# Patient Record
Sex: Male | Born: 1995 | Race: White | Hispanic: No | Marital: Single | Smoking: Never smoker
Health system: Southern US, Community
[De-identification: ages and names within clinical notes are randomized; demographics above are authoritative.]

---
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
## 2018-09-27 DIAGNOSIS — L255 Unspecified contact dermatitis due to plants, except food: Secondary | ICD-10-CM | POA: Diagnosis not present

## 2019-08-12 ENCOUNTER — Encounter: Payer: Self-pay | Admitting: Family Medicine

## 2019-08-12 ENCOUNTER — Other Ambulatory Visit: Payer: Self-pay

## 2019-08-12 ENCOUNTER — Ambulatory Visit (INDEPENDENT_AMBULATORY_CARE_PROVIDER_SITE_OTHER): Payer: BC Managed Care – PPO | Admitting: Family Medicine

## 2019-08-12 VITALS — BP 124/74 | HR 79 | Temp 98.4°F | Ht 71.0 in | Wt 188.6 lb

## 2019-08-12 DIAGNOSIS — Z13220 Encounter for screening for lipoid disorders: Secondary | ICD-10-CM | POA: Diagnosis not present

## 2019-08-12 DIAGNOSIS — Z131 Encounter for screening for diabetes mellitus: Secondary | ICD-10-CM | POA: Diagnosis not present

## 2019-08-12 DIAGNOSIS — N5089 Other specified disorders of the male genital organs: Secondary | ICD-10-CM

## 2019-08-12 DIAGNOSIS — Z0001 Encounter for general adult medical examination with abnormal findings: Secondary | ICD-10-CM

## 2019-08-12 DIAGNOSIS — R0789 Other chest pain: Secondary | ICD-10-CM | POA: Diagnosis not present

## 2019-08-12 DIAGNOSIS — E7800 Pure hypercholesterolemia, unspecified: Secondary | ICD-10-CM | POA: Insufficient documentation

## 2019-08-12 DIAGNOSIS — Z6826 Body mass index (BMI) 26.0-26.9, adult: Secondary | ICD-10-CM

## 2019-08-12 DIAGNOSIS — E663 Overweight: Secondary | ICD-10-CM

## 2019-08-12 LAB — LIPID PANEL
Cholesterol: 169 mg/dL (ref 0–200)
HDL: 41.1 mg/dL (ref >39.00)
LDL Cholesterol: 114 mg/dL — ABNORMAL HIGH (ref 0–99)
NonHDL: 127.9
Total CHOL/HDL Ratio: 4
Triglycerides: 69 mg/dL (ref 0.0–149.0)
VLDL: 13.8 mg/dL (ref 0.0–40.0)

## 2019-08-12 LAB — GLUCOSE, RANDOM: Glucose, Bld: 90 mg/dL (ref 70–99)

## 2019-08-12 NOTE — Progress Notes (Signed)
Please inform patient of the following:  His "bad" cholesterol just a little bit high.  The rest of his labs are normal.  Do not need start medication at this point but he should continue working on eating healthy diet and staying active.  We can recheck in 1 year or so.  Randall Cain. Jerline Pain, MD 08/12/2019 4:42 PM

## 2019-08-12 NOTE — Patient Instructions (Signed)
It was very nice to see you today!  Your chest pain is from a strained muscle. You can use heating pads and antiinflammatories as needed. Let me know if it worsens.  Your testicle lump is benign. This is called a varicocele. Let me know if it changes in anyway.  We will check blood work today.  Come back to see me in 1 year, or sooner if needed.   Take care, Dr Jerline Pain  Please try these tips to maintain a healthy lifestyle:   Eat at least 3 REAL meals and 1-2 snacks per day.  Aim for no more than 5 hours between eating.  If you eat breakfast, please do so within one hour of getting up.    Obtain twice as many fruits/vegetables as protein or carbohydrate foods for both lunch and dinner. (Half of each meal should be fruits/vegetables, one quarter protein, and one quarter starchy carbs)   Cut down on sweet beverages. This includes juice, soda, and sweet tea.    Exercise at least 150 minutes every week.    Preventive Care 59-18 Years Old, Male Preventive care refers to lifestyle choices and visits with your health care provider that can promote health and wellness. This includes:  A yearly physical exam. This is also called an annual well check.  Regular dental and eye exams.  Immunizations.  Screening for certain conditions.  Healthy lifestyle choices, such as eating a healthy diet, getting regular exercise, not using drugs or products that contain nicotine and tobacco, and limiting alcohol use. What can I expect for my preventive care visit? Physical exam Your health care provider will check:  Height and weight. These may be used to calculate body mass index (BMI), which is a measurement that tells if you are at a healthy weight.  Heart rate and blood pressure.  Your skin for abnormal spots. Counseling Your health care provider may ask you questions about:  Alcohol, tobacco, and drug use.  Emotional well-being.  Home and relationship well-being.  Sexual  activity.  Eating habits.  Work and work Statistician. What immunizations do I need?  Influenza (flu) vaccine  This is recommended every year. Tetanus, diphtheria, and pertussis (Tdap) vaccine  You may need a Td booster every 10 years. Varicella (chickenpox) vaccine  You may need this vaccine if you have not already been vaccinated. Human papillomavirus (HPV) vaccine  If recommended by your health care provider, you may need three doses over 6 months. Measles, mumps, and rubella (MMR) vaccine  You may need at least one dose of MMR. You may also need a second dose. Meningococcal conjugate (MenACWY) vaccine  One dose is recommended if you are 13-71 years old and a Market researcher living in a residence hall, or if you have one of several medical conditions. You may also need additional booster doses. Pneumococcal conjugate (PCV13) vaccine  You may need this if you have certain conditions and were not previously vaccinated. Pneumococcal polysaccharide (PPSV23) vaccine  You may need one or two doses if you smoke cigarettes or if you have certain conditions. Hepatitis A vaccine  You may need this if you have certain conditions or if you travel or work in places where you may be exposed to hepatitis A. Hepatitis B vaccine  You may need this if you have certain conditions or if you travel or work in places where you may be exposed to hepatitis B. Haemophilus influenzae type b (Hib) vaccine  You may need this if you have  certain risk factors. You may receive vaccines as individual doses or as more than one vaccine together in one shot (combination vaccines). Talk with your health care provider about the risks and benefits of combination vaccines. What tests do I need? Blood tests  Lipid and cholesterol levels. These may be checked every 5 years starting at age 88.  Hepatitis C test.  Hepatitis B test. Screening   Diabetes screening. This is done by checking your  blood sugar (glucose) after you have not eaten for a while (fasting).  Sexually transmitted disease (STD) testing. Talk with your health care provider about your test results, treatment options, and if necessary, the need for more tests. Follow these instructions at home: Eating and drinking   Eat a diet that includes fresh fruits and vegetables, whole grains, lean protein, and low-fat dairy products.  Take vitamin and mineral supplements as recommended by your health care provider.  Do not drink alcohol if your health care provider tells you not to drink.  If you drink alcohol: ? Limit how much you have to 0-2 drinks a day. ? Be aware of how much alcohol is in your drink. In the [place] one drink equals one 12 oz bottle of beer (355 mL), one 5 oz glass of wine (148 mL), or one 1 oz glass of hard liquor (44 mL). Lifestyle  Take daily care of your teeth and gums.  Stay active. Exercise for at least 30 minutes on 5 or more days each week.  Do not use any products that contain nicotine or tobacco, such as cigarettes, e-cigarettes, and chewing tobacco. If you need help quitting, ask your health care provider.  If you are sexually active, practice safe sex. Use a condom or other form of protection to prevent STIs (sexually transmitted infections). What's next?  Go to your health care provider once a year for a well check visit.  Ask your health care provider how often you should have your eyes and teeth checked.  Stay up to date on all vaccines. This information is not intended to replace advice given to you by your health care provider. Make sure you discuss any questions you have with your health care provider. Document Released: 02/10/2002 Document Revised: 12/09/2018 Document Reviewed: 12/09/2018 Elsevier Patient Education  2020 [employer].

## 2019-08-12 NOTE — Progress Notes (Signed)
Chief Complaint:  Randall Cain is a 23 y.o. male who presents today for his annual comprehensive physical exam and to establish care.   Assessment/Plan:  Chest Wall Pain No red flags.  Consistent with muscular strain.  Does not currently have any symptoms.  Recommended NSAIDs as needed.  Discussed reasons to return to care.  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  Given his symptoms have not changed for several years, this is most likely a benign finding.  We will continue with watchful waiting.  Discussed reasons to return to care.   Body mass index is 26.3 kg/m. / Overweight BMI Metric Follow Up - 08/12/19 0926      BMI Metric Follow Up-Please document annually   BMI Metric Follow Up  Education provided       Preventative Healthcare: Check lipid panel and fasting glucose.   Patient Counseling(The following topics were reviewed and/or handout was given):  -Nutrition: Stressed importance of moderation in sodium/caffeine intake, saturated fat and cholesterol, caloric balance, sufficient intake of fresh fruits, vegetables, and fiber.  -Stressed the importance of regular exercise.   -Substance Abuse: Discussed cessation/primary prevention of tobacco, alcohol, or other drug use; driving or other dangerous activities under the influence; availability of treatment for abuse.   -Injury prevention: Discussed safety belts, safety helmets, smoke detector, smoking near bedding or upholstery.   -Sexuality: Discussed sexually transmitted diseases, partner selection, use of condoms, avoidance of unintended pregnancy and contraceptive alternatives.   -Dental health: Discussed importance of regular tooth brushing, flossing, and dental visits.  -Health maintenance and immunizations reviewed. Please refer to Health maintenance section.  Return to care in 1 year for next preventative visit.     Subjective:  HPI:  He has no acute complaints today.   Chest Pain No current chest  pain.  Patient reports intermittent episodes over the past few years of left-sided chest pain that starts in the middle of his chest and then progresses to his left shoulder over a few days.  Pain is usually worse with certain movements and positions.  Pain is also worse with palpation.  Usually takes ibuprofen which alleviates the pain.  Patient's father died from a heart attack earlier this year and is worried about possible heart disease.  Does not have any exertional symptoms.  No reported shortness of breath.  Also has a testicular lump on his left testicle.  This is been there for several years.  No obvious precipitating events.  No pain.  Has not changed in size for several years. Lifestyle Diet: Trying to eat and balanced diet.  Exercise: Likes to play golf. Plays in slow pitch softball league.   Depression screen PHQ 2/9 08/12/2019  Decreased Interest 0  Down, Depressed, Hopeless 0  PHQ - 2 Score 0    Health Maintenance Due  Topic Date Due  . HIV Screening  10/05/2009  . TETANUS/TDAP  11/20/2015  . INFLUENZA VACCINE  07/30/2019    ROS: Per HPI, otherwise a complete review of systems was negative.   PMH:  The following were reviewed and entered/updated in epic: History reviewed. No pertinent past medical history. There are no active problems to display for this patient.  History reviewed. No pertinent surgical history.  Family History  Problem Relation Age of Onset  . Heart disease Father     Medications- reviewed and updated No current outpatient medications on file.   No current facility-administered medications for this visit.     Allergies-reviewed and updated Allergies  Allergen  Reactions  . Amoxicillin Rash  . Clarithromycin Rash    Social History   Socioeconomic History  . Marital status: Single    Spouse name: Not on file  . Number of children: Not on file  . Years of education: Not on file  . Highest education level: Not on file  Occupational  History  . Not on file  Social Needs  . Financial resource strain: Not on file  . Food insecurity    Worry: Not on file    Inability: Not on file  . Transportation needs    Medical: Not on file    Non-medical: Not on file  Tobacco Use  . Smoking status: Never Smoker  . Smokeless tobacco: Current User    Types: Snuff  Substance and Sexual Activity  . Alcohol use: Yes  . Drug use: Yes    Types: Marijuana    Comment: Only on weekends  . Sexual activity: Not on file  Lifestyle  . Physical activity    Days per week: Not on file    Minutes per session: Not on file  . Stress: Not on file  Relationships  . Social Musicianconnections    Talks on phone: Not on file    Gets together: Not on file    Attends religious service: Not on file    Active member of club or organization: Not on file    Attends meetings of clubs or organizations: Not on file    Relationship status: Not on file  Other Topics Concern  . Not on file  Social History Narrative  . Not on file        Objective:  Physical Exam: BP 124/74 (BP Location: Left Arm, Patient Position: Sitting, Cuff Size: Normal)   Pulse 79   Temp 98.4 F (36.9 C) (Oral)   Ht 5\' 11"  (1.803 m)   Wt 188 lb 9.6 oz (85.5 kg)   SpO2 97%   BMI 26.30 kg/m   Body mass index is 26.3 kg/m. Wt Readings from Last 3 Encounters:  08/12/19 188 lb 9.6 oz (85.5 kg)  02/13/12 162 lb 4.8 oz (73.6 kg) (75 %, Z= 0.66)*  12/03/11 151 lb 11.2 oz (68.8 kg) (63 %, Z= 0.33)*   * Growth percentiles are based on CDC (Boys, 2-20 Years) data.   Gen: NAD, resting comfortably HEENT: TMs normal bilaterally. OP clear. No thyromegaly noted.  CV: RRR with no murmurs appreciated Pulm: NWOB, CTAB with no crackles, wheezes, or rhonchi GI: Normal bowel sounds present. Soft, Nontender, Nondistended. GU: Left testicle very small mobile structure on posterior aspect MSK: no edema, cyanosis, or clubbing noted Skin: warm, dry Neuro: CN2-12 grossly intact. Strength 5/5 in  upper and lower extremities. Reflexes symmetric and intact bilaterally.  Psych: Normal affect and thought content     Randall Burch M. Jimmey RalphParker, MD 08/12/2019 9:26 AM

## 2019-10-10 DIAGNOSIS — R5381 Other malaise: Secondary | ICD-10-CM | POA: Diagnosis not present

## 2019-10-10 DIAGNOSIS — Z20828 Contact with and (suspected) exposure to other viral communicable diseases: Secondary | ICD-10-CM | POA: Diagnosis not present

## 2019-10-20 DIAGNOSIS — R430 Anosmia: Secondary | ICD-10-CM | POA: Diagnosis not present

## 2019-10-20 DIAGNOSIS — R438 Other disturbances of smell and taste: Secondary | ICD-10-CM | POA: Diagnosis not present

## 2020-11-02 ENCOUNTER — Ambulatory Visit (INDEPENDENT_AMBULATORY_CARE_PROVIDER_SITE_OTHER): Payer: 59 | Admitting: Family Medicine

## 2020-11-02 ENCOUNTER — Other Ambulatory Visit: Payer: Self-pay

## 2020-11-02 ENCOUNTER — Encounter: Payer: Self-pay | Admitting: Family Medicine

## 2020-11-02 VITALS — BP 127/76 | HR 69 | Temp 98.6°F | Ht 71.0 in | Wt 186.4 lb

## 2020-11-02 DIAGNOSIS — E7800 Pure hypercholesterolemia, unspecified: Secondary | ICD-10-CM

## 2020-11-02 DIAGNOSIS — Z23 Encounter for immunization: Secondary | ICD-10-CM | POA: Diagnosis not present

## 2020-11-02 DIAGNOSIS — Z6826 Body mass index (BMI) 26.0-26.9, adult: Secondary | ICD-10-CM | POA: Diagnosis not present

## 2020-11-02 DIAGNOSIS — E663 Overweight: Secondary | ICD-10-CM

## 2020-11-02 DIAGNOSIS — Z0001 Encounter for general adult medical examination with abnormal findings: Secondary | ICD-10-CM | POA: Diagnosis not present

## 2020-11-02 NOTE — Patient Instructions (Signed)
It was very nice to see you today!  Please give your flu vaccine today.  We will check blood work today.  Please select see me in 1 year for your next physical.  Please come back to see me sooner if needed.  Take care, Dr Jerline Pain  Please try these tips to maintain a healthy lifestyle:   Eat at least 3 REAL meals and 1-2 snacks per day.  Aim for no more than 5 hours between eating.  If you eat breakfast, please do so within one hour of getting up.    Each meal should contain half fruits/vegetables, one quarter protein, and one quarter carbs (no bigger than a computer mouse)   Cut down on sweet beverages. This includes juice, soda, and sweet tea.     Drink at least 1 glass of water with each meal and aim for at least 8 glasses per day   Exercise at least 150 minutes every week.    Preventive Care 33-4 Years Old, Male Preventive care refers to lifestyle choices and visits with your health care provider that can promote health and wellness. This includes:  A yearly physical exam. This is also called an annual well check.  Regular dental and eye exams.  Immunizations.  Screening for certain conditions.  Healthy lifestyle choices, such as eating a healthy diet, getting regular exercise, not using drugs or products that contain nicotine and tobacco, and limiting alcohol use. What can I expect for my preventive care visit? Physical exam Your health care provider will check:  Height and weight. These may be used to calculate body mass index (BMI), which is a measurement that tells if you are at a healthy weight.  Heart rate and blood pressure.  Your skin for abnormal spots. Counseling Your health care provider may ask you questions about:  Alcohol, tobacco, and drug use.  Emotional well-being.  Home and relationship well-being.  Sexual activity.  Eating habits.  Work and work Statistician. What immunizations do I need?  Influenza (flu) vaccine  This is  recommended every year. Tetanus, diphtheria, and pertussis (Tdap) vaccine  You may need a Td booster every 10 years. Varicella (chickenpox) vaccine  You may need this vaccine if you have not already been vaccinated. Human papillomavirus (HPV) vaccine  If recommended by your health care provider, you may need three doses over 6 months. Measles, mumps, and rubella (MMR) vaccine  You may need at least one dose of MMR. You may also need a second dose. Meningococcal conjugate (MenACWY) vaccine  One dose is recommended if you are 12-51 years old and a Market researcher living in a residence hall, or if you have one of several medical conditions. You may also need additional booster doses. Pneumococcal conjugate (PCV13) vaccine  You may need this if you have certain conditions and were not previously vaccinated. Pneumococcal polysaccharide (PPSV23) vaccine  You may need one or two doses if you smoke cigarettes or if you have certain conditions. Hepatitis A vaccine  You may need this if you have certain conditions or if you travel or work in places where you may be exposed to hepatitis A. Hepatitis B vaccine  You may need this if you have certain conditions or if you travel or work in places where you may be exposed to hepatitis B. Haemophilus influenzae type b (Hib) vaccine  You may need this if you have certain risk factors. You may receive vaccines as individual doses or as more than one vaccine together  in one shot (combination vaccines). Talk with your health care provider about the risks and benefits of combination vaccines. What tests do I need? Blood tests  Lipid and cholesterol levels. These may be checked every 5 years starting at age 44.  Hepatitis C test.  Hepatitis B test. Screening   Diabetes screening. This is done by checking your blood sugar (glucose) after you have not eaten for a while (fasting).  Sexually transmitted disease (STD) testing. Talk with  your health care provider about your test results, treatment options, and if necessary, the need for more tests. Follow these instructions at home: Eating and drinking   Eat a diet that includes fresh fruits and vegetables, whole grains, lean protein, and low-fat dairy products.  Take vitamin and mineral supplements as recommended by your health care provider.  Do not drink alcohol if your health care provider tells you not to drink.  If you drink alcohol: ? Limit how much you have to 0-2 drinks a day. ? Be aware of how much alcohol is in your drink. In the [place] one drink equals one 12 oz bottle of beer (355 mL), one 5 oz glass of wine (148 mL), or one 1 oz glass of hard liquor (44 mL). Lifestyle  Take daily care of your teeth and gums.  Stay active. Exercise for at least 30 minutes on 5 or more days each week.  Do not use any products that contain nicotine or tobacco, such as cigarettes, e-cigarettes, and chewing tobacco. If you need help quitting, ask your health care provider.  If you are sexually active, practice safe sex. Use a condom or other form of protection to prevent STIs (sexually transmitted infections). What's next?  Go to your health care provider once a year for a well check visit.  Ask your health care provider how often you should have your eyes and teeth checked.  Stay up to date on all vaccines. This information is not intended to replace advice given to you by your health care provider. Make sure you discuss any questions you have with your health care provider. Document Revised: 12/09/2018 Document Reviewed: 12/09/2018 Elsevier Patient Education  2020 [employer].

## 2020-11-02 NOTE — Assessment & Plan Note (Signed)
Check CBC, CMET, TSH, lipid panel. Continue lifestyle modifications.  

## 2020-11-02 NOTE — Progress Notes (Signed)
Chief Complaint:  Randall Cain is a 24 y.o. male who presents today for his annual comprehensive physical exam.    Assessment/Plan:  Chronic Problems Addressed Today: Elevated LDL cholesterol level Check CBC, CMET, TSH, lipid panel.  Continue lifestyle modifications.  Body mass index is 26 kg/m. / Overweight  BMI Metric Follow Up - 11/02/20 1404      BMI Metric Follow Up-Please document annually   BMI Metric Follow Up Education provided           Preventative Healthcare: Flu vaccine given today.  Discussed Covid vaccine.  Will check CBC, CMET, TSH, lipid panel.  Patient Counseling(The following topics were reviewed and/or handout was given):  -Nutrition: Stressed importance of moderation in sodium/caffeine intake, saturated fat and cholesterol, caloric balance, sufficient intake of fresh fruits, vegetables, and fiber.  -Stressed the importance of regular exercise.   -Substance Abuse: Discussed cessation/primary prevention of tobacco, alcohol, or other drug use; driving or other dangerous activities under the influence; availability of treatment for abuse.   -Injury prevention: Discussed safety belts, safety helmets, smoke detector, smoking near bedding or upholstery.   -Sexuality: Discussed sexually transmitted diseases, partner selection, use of condoms, avoidance of unintended pregnancy and contraceptive alternatives.   -Dental health: Discussed importance of regular tooth brushing, flossing, and dental visits.  -Health maintenance and immunizations reviewed. Please refer to Health maintenance section.  Return to care in 1 year for next preventative visit.     Subjective:  HPI:  He has no acute complaints today.   Lifestyle Diet: Balanced.  Exercise: None.   Depression screen PHQ 2/9 08/12/2019  Decreased Interest 0  Down, Depressed, Hopeless 0  PHQ - 2 Score 0   Health Maintenance Due  Topic Date Due  . Hepatitis C Screening  Never done  . HIV Screening   Never done  . TETANUS/TDAP  11/20/2015    ROS: Per HPI, otherwise a complete review of systems was negative.   PMH:  The following were reviewed and entered/updated in epic: History reviewed. No pertinent past medical history. Patient Active Problem List   Diagnosis Date Noted  . Elevated LDL cholesterol level 08/12/2019   History reviewed. No pertinent surgical history.  Family History  Problem Relation Age of Onset  . Heart disease Father     Medications- reviewed and updated No current outpatient medications on file.   No current facility-administered medications for this visit.    Allergies-reviewed and updated Allergies  Allergen Reactions  . Amoxicillin Rash  . Clarithromycin Rash    Social History   Socioeconomic History  . Marital status: Single    Spouse name: Not on file  . Number of children: Not on file  . Years of education: Not on file  . Highest education level: Not on file  Occupational History  . Not on file  Tobacco Use  . Smoking status: Never Smoker  . Smokeless tobacco: Current User    Types: Snuff  Vaping Use  . Vaping Use: Every day  Substance and Sexual Activity  . Alcohol use: Yes  . Drug use: Yes    Types: Marijuana    Comment: Only on weekends  . Sexual activity: Not on file  Other Topics Concern  . Not on file  Social History Narrative  . Not on file   Social Determinants of Health   Financial Resource Strain:   . Difficulty of Paying Living Expenses: Not on file  Food Insecurity:   . Worried About Radiation protection practitioner  of Food in the Last Year: Not on file  . Ran Out of Food in the Last Year: Not on file  Transportation Needs:   . Lack of Transportation (Medical): Not on file  . Lack of Transportation (Non-Medical): Not on file  Physical Activity:   . Days of Exercise per Week: Not on file  . Minutes of Exercise per Session: Not on file  Stress:   . Feeling of Stress : Not on file  Social Connections:   . Frequency of  Communication with Friends and Family: Not on file  . Frequency of Social Gatherings with Friends and Family: Not on file  . Attends Religious Services: Not on file  . Active Member of Clubs or Organizations: Not on file  . Attends Banker Meetings: Not on file  . Marital Status: Not on file        Objective:  Physical Exam: BP 127/76   Pulse 69   Temp 98.6 F (37 C)   Ht 5\' 11"  (1.803 m)   Wt 186 lb 6.4 oz (84.6 kg)   SpO2 99%   BMI 26.00 kg/m   Body mass index is 26 kg/m. Wt Readings from Last 3 Encounters:  11/02/20 186 lb 6.4 oz (84.6 kg)  08/12/19 188 lb 9.6 oz (85.5 kg)  02/13/12 162 lb 4.8 oz (73.6 kg) (75 %, Z= 0.66)*   * Growth percentiles are based on CDC (Boys, 2-20 Years) data.   Gen: NAD, resting comfortably HEENT: TMs normal bilaterally. OP clear. No thyromegaly noted.  CV: RRR with no murmurs appreciated Pulm: NWOB, CTAB with no crackles, wheezes, or rhonchi GI: Normal bowel sounds present. Soft, Nontender, Nondistended. MSK: no edema, cyanosis, or clubbing noted Skin: warm, dry Neuro: CN2-12 grossly intact. Strength 5/5 in upper and lower extremities. Reflexes symmetric and intact bilaterally.  Psych: Normal affect and thought content     Ethel Veronica M. 02/15/12, MD 11/02/2020 2:05 PM

## 2020-11-03 LAB — CBC
HCT: 45.1 % (ref 38.5–50.0)
Hemoglobin: 15.2 g/dL (ref 13.2–17.1)
MCH: 30.8 pg (ref 27.0–33.0)
MCHC: 33.7 g/dL (ref 32.0–36.0)
MCV: 91.3 fL (ref 80.0–100.0)
MPV: 11.3 fL (ref 7.5–12.5)
Platelets: 283 10*3/uL (ref 140–400)
RBC: 4.94 10*6/uL (ref 4.20–5.80)
RDW: 11.9 % (ref 11.0–15.0)
WBC: 5.6 10*3/uL (ref 3.8–10.8)

## 2020-11-03 LAB — COMPREHENSIVE METABOLIC PANEL
AG Ratio: 1.9 (calc) (ref 1.0–2.5)
ALT: 18 U/L (ref 9–46)
AST: 17 U/L (ref 10–40)
Albumin: 4.8 g/dL (ref 3.6–5.1)
Alkaline phosphatase (APISO): 70 U/L (ref 36–130)
BUN: 10 mg/dL (ref 7–25)
CO2: 27 mmol/L (ref 20–32)
Calcium: 10 mg/dL (ref 8.6–10.3)
Chloride: 103 mmol/L (ref 98–110)
Creat: 1.02 mg/dL (ref 0.60–1.35)
Globulin: 2.5 g/dL (calc) (ref 1.9–3.7)
Glucose, Bld: 88 mg/dL (ref 65–99)
Potassium: 4.4 mmol/L (ref 3.5–5.3)
Sodium: 140 mmol/L (ref 135–146)
Total Bilirubin: 0.8 mg/dL (ref 0.2–1.2)
Total Protein: 7.3 g/dL (ref 6.1–8.1)

## 2020-11-03 LAB — LIPID PANEL
Cholesterol: 191 mg/dL (ref <200)
HDL: 44 mg/dL (ref >=40)
LDL Cholesterol (Calc): 127 mg/dL (calc) — ABNORMAL HIGH
Non-HDL Cholesterol (Calc): 147 mg/dL (calc) — ABNORMAL HIGH (ref <130)
Total CHOL/HDL Ratio: 4.3 (calc) (ref <5.0)
Triglycerides: 101 mg/dL (ref <150)

## 2020-11-03 LAB — TSH: TSH: 0.92 mIU/L (ref 0.40–4.50)

## 2020-11-05 NOTE — Progress Notes (Signed)
Please inform patient of the following:  "bad" cholesterol a bit elevated. Everything else is NORMAL. Would like for him to keep working on diet and exercise and we can recheck in a year.  Katina Degree. Jimmey Ralph, MD 11/05/2020 2:47 PM

## 2021-11-08 ENCOUNTER — Encounter: Payer: 59 | Admitting: Family Medicine

## 2021-11-29 ENCOUNTER — Encounter: Payer: 59 | Admitting: Family Medicine

## 2021-12-06 ENCOUNTER — Encounter: Payer: Self-pay | Admitting: Family Medicine

## 2021-12-06 ENCOUNTER — Other Ambulatory Visit: Payer: Self-pay

## 2021-12-06 ENCOUNTER — Ambulatory Visit (INDEPENDENT_AMBULATORY_CARE_PROVIDER_SITE_OTHER): Payer: 59 | Admitting: Family Medicine

## 2021-12-06 VITALS — BP 128/82 | HR 62 | Temp 98.4°F | Ht 71.0 in | Wt 190.6 lb

## 2021-12-06 DIAGNOSIS — R4589 Other symptoms and signs involving emotional state: Secondary | ICD-10-CM | POA: Insufficient documentation

## 2021-12-06 DIAGNOSIS — E7800 Pure hypercholesterolemia, unspecified: Secondary | ICD-10-CM | POA: Diagnosis not present

## 2021-12-06 DIAGNOSIS — Z0001 Encounter for general adult medical examination with abnormal findings: Secondary | ICD-10-CM | POA: Diagnosis not present

## 2021-12-06 LAB — CBC
HCT: 43 % (ref 39.0–52.0)
Hemoglobin: 14.1 g/dL (ref 13.0–17.0)
MCHC: 32.8 g/dL (ref 30.0–36.0)
MCV: 90.9 fl (ref 78.0–100.0)
Platelets: 250 10*3/uL (ref 150.0–400.0)
RBC: 4.73 Mil/uL (ref 4.22–5.81)
RDW: 12.4 % (ref 11.5–15.5)
WBC: 4.9 10*3/uL (ref 4.0–10.5)

## 2021-12-06 LAB — COMPREHENSIVE METABOLIC PANEL
ALT: 19 U/L (ref 0–53)
AST: 17 U/L (ref 0–37)
Albumin: 4.4 g/dL (ref 3.5–5.2)
Alkaline Phosphatase: 60 U/L (ref 39–117)
BUN: 11 mg/dL (ref 6–23)
CO2: 29 mEq/L (ref 19–32)
Calcium: 9.4 mg/dL (ref 8.4–10.5)
Chloride: 103 mEq/L (ref 96–112)
Creatinine, Ser: 0.97 mg/dL (ref 0.40–1.50)
GFR: 107.24 mL/min (ref >60.00)
Glucose, Bld: 85 mg/dL (ref 70–99)
Potassium: 4 mEq/L (ref 3.5–5.1)
Sodium: 139 mEq/L (ref 135–145)
Total Bilirubin: 0.6 mg/dL (ref 0.2–1.2)
Total Protein: 7 g/dL (ref 6.0–8.3)

## 2021-12-06 LAB — LIPID PANEL
Cholesterol: 181 mg/dL (ref 0–200)
HDL: 41.6 mg/dL (ref >39.00)
LDL Cholesterol: 124 mg/dL — ABNORMAL HIGH (ref 0–99)
NonHDL: 139.64
Total CHOL/HDL Ratio: 4
Triglycerides: 76 mg/dL (ref 0.0–149.0)
VLDL: 15.2 mg/dL (ref 0.0–40.0)

## 2021-12-06 LAB — TSH: TSH: 0.86 u[IU]/mL (ref 0.35–5.50)

## 2021-12-06 NOTE — Assessment & Plan Note (Signed)
Discussed treatment options.  He does not wish to start medication at this point.  We will place referral for him to see a therapist.  No SI or HI.  We will check in with me in a couple of weeks.  Discussed reasons to return to care and seek emergent care.

## 2021-12-06 NOTE — Progress Notes (Signed)
Chief Complaint:  Randall Cain is a 25 y.o. male who presents today for his annual comprehensive physical exam.    Assessment/Plan:  New/Acute Problems: Thigh pain No red flags.  Possibly mild quadricep strain.  Reassured patient has good blood flow no signs of blockages.  Given that symptoms are improving we will continue with watchful waiting.  Discussed reasons to return to care.  Chronic Problems Addressed Today: Depressed mood Discussed treatment options.  He does not wish to start medication at this point.  We will place referral for him to see a therapist.  No SI or HI.  We will check in with me in a couple of weeks.  Discussed reasons to return to care and seek emergent care.  Elevated LDL cholesterol level Discussed lifestyle modifications.  Check labs.  Preventative Healthcare: Flu shot today.  Check labs.  Patient Counseling(The following topics were reviewed and/or handout was given):  -Nutrition: Stressed importance of moderation in sodium/caffeine intake, saturated fat and cholesterol, caloric balance, sufficient intake of fresh fruits, vegetables, and fiber.  -Stressed the importance of regular exercise.   -Substance Abuse: Discussed cessation/primary prevention of tobacco, alcohol, or other drug use; driving or other dangerous activities under the influence; availability of treatment for abuse.   -Injury prevention: Discussed safety belts, safety helmets, smoke detector, smoking near bedding or upholstery.   -Sexuality: Discussed sexually transmitted diseases, partner selection, use of condoms, avoidance of unintended pregnancy and contraceptive alternatives.   -Dental health: Discussed importance of regular tooth brushing, flossing, and dental visits.  -Health maintenance and immunizations reviewed. Please refer to Health maintenance section.  Return to care in 1 year for next preventative visit.     Subjective:  HPI:  He has no acute complaints today.   He  has had 5 pain for last few days.  Started after walking.  He is worried about blockages.  Is also had depressed mood for the last several weeks.  Has been under a lot of life stress.  Has moved several times.  Will be moving again soon.  He has also been looking for a new job at his new place.  He is also concerned about some bad investments that he has made.  No SI or HI.  He would like to see a therapist.  Lifestyle Diet: None specific.  Exercise: Limited. Tries to do a lot of walking.   Depression screen PHQ 2/9 12/06/2021  Decreased Interest 0  Down, Depressed, Hopeless 2  PHQ - 2 Score 2  Altered sleeping 1  Tired, decreased energy 1  Change in appetite 0  Feeling bad or failure about yourself  1  Trouble concentrating 1  Moving slowly or fidgety/restless 0  Suicidal thoughts 0  PHQ-9 Score 6  Difficult doing work/chores -    Health Maintenance Due  Topic Date Due   COVID-19 Vaccine (1) Never done   HIV Screening  Never done   Hepatitis C Screening  Never done   TETANUS/TDAP  11/20/2015   INFLUENZA VACCINE  07/29/2021     ROS: Per HPI, otherwise a complete review of systems was negative.   PMH:  The following were reviewed and entered/updated in epic: History reviewed. No pertinent past medical history. Patient Active Problem List   Diagnosis Date Noted   Depressed mood 12/06/2021   Elevated LDL cholesterol level 08/12/2019   History reviewed. No pertinent surgical history.  Family History  Problem Relation Age of Onset   Heart disease Father  Medications- reviewed and updated Current Outpatient Medications  Medication Sig Dispense Refill   Cetirizine HCl (ZYRTEC PO) Take by mouth.     No current facility-administered medications for this visit.    Allergies-reviewed and updated Allergies  Allergen Reactions   Amoxicillin Rash   Clarithromycin Rash    Social History   Socioeconomic History   Marital status: Single    Spouse name: Not on  file   Number of children: Not on file   Years of education: Not on file   Highest education level: Not on file  Occupational History   Not on file  Tobacco Use   Smoking status: Never   Smokeless tobacco: Current    Types: Snuff  Vaping Use   Vaping Use: Every day  Substance and Sexual Activity   Alcohol use: Yes   Drug use: Yes    Types: Marijuana    Comment: Only on weekends   Sexual activity: Not on file  Other Topics Concern   Not on file  Social History Narrative   Not on file   Social Determinants of Health   Financial Resource Strain: Not on file  Food Insecurity: Not on file  Transportation Needs: Not on file  Physical Activity: Not on file  Stress: Not on file  Social Connections: Not on file        Objective:  Physical Exam: BP 128/82 (BP Location: Left Arm, Patient Position: Sitting, Cuff Size: Large)   Pulse 62   Temp 98.4 F (36.9 C) (Temporal)   Ht 5\' 11"  (1.803 m)   Wt 190 lb 9.6 oz (86.5 kg)   SpO2 96%   BMI 26.58 kg/m   Body mass index is 26.58 kg/m. Wt Readings from Last 3 Encounters:  12/06/21 190 lb 9.6 oz (86.5 kg)  11/02/20 186 lb 6.4 oz (84.6 kg)  08/12/19 188 lb 9.6 oz (85.5 kg)   Gen: NAD, resting comfortably HEENT: TMs normal bilaterally. OP clear. No thyromegaly noted.  CV: RRR with no murmurs appreciated Pulm: NWOB, CTAB with no crackles, wheezes, or rhonchi GI: Normal bowel sounds present. Soft, Nontender, Nondistended. MSK: no edema, cyanosis, or clubbing noted.  Neurovascular intact distally.  Good distal pulses. Skin: warm, dry Neuro: CN2-12 grossly intact. Strength 5/5 in upper and lower extremities. Reflexes symmetric and intact bilaterally.  Psych: Normal affect and thought content     Lesly Joslyn M. 08/14/19, MD 12/06/2021 2:05 PM

## 2021-12-06 NOTE — Patient Instructions (Signed)
It was very nice to see you today!  We will check blood work today.  We will give your flu shot today.  I will refer you to see a therapist.  Please send a message in a few weeks and let me know how this is doing.  Will be due for your next physical in 1 year. Please let us know if you would like to be seen sooner  Take care, Dr Jimmey Ralph  PLEASE NOTE:  If you had any lab tests please let us know if you have not heard back within a few days. You may see your results on mychart before we have a chance to review them but we will give you a call once they are reviewed by [place]. If we ordered any referrals today, please let us know if you have not heard from their office within the next week.   Please try these tips to maintain a healthy lifestyle:  Eat at least 3 REAL meals and 1-2 snacks per day.  Aim for no more than 5 hours between eating.  If you eat breakfast, please do so within one hour of getting up.   Each meal should contain half fruits/vegetables, one quarter protein, and one quarter carbs (no bigger than a computer mouse)  Cut down on sweet beverages. This includes juice, soda, and sweet tea.   Drink at least 1 glass of water with each meal and aim for at least 8 glasses per day  Exercise at least 150 minutes every week.    Preventive Care 82-20 Years Old, Male Preventive care refers to lifestyle choices and visits with your health care provider that can promote health and wellness. Preventive care visits are also called wellness exams. What can I expect for my preventive care visit? Counseling During your preventive care visit, your health care provider may ask about your: Medical history, including: Past medical problems. Family medical history. Current health, including: Emotional well-being. Home life and relationship well-being. Sexual activity. Lifestyle, including: Alcohol, nicotine or tobacco, and drug use. Access to firearms. Diet, exercise, and sleep  habits. Safety issues such as seatbelt and bike helmet use. Sunscreen use. Work and work Astronomer. Physical exam Your health care provider may check your: Height and weight. These may be used to calculate your BMI (body mass index). BMI is a measurement that tells if you are at a healthy weight. Waist circumference. This measures the distance around your waistline. This measurement also tells if you are at a healthy weight and may help predict your risk of certain diseases, such as type 2 diabetes and high blood pressure. Heart rate and blood pressure. Body temperature. Skin for abnormal spots. What immunizations do I need? Vaccines are usually given at various ages, according to a schedule. Your health care provider will recommend vaccines for you based on your age, medical history, and lifestyle or other factors, such as travel or where you work. What tests do I need? Screening Your health care provider may recommend screening tests for certain conditions. This may include: Lipid and cholesterol levels. Diabetes screening. This is done by checking your blood sugar (glucose) after you have not eaten for a while (fasting). Hepatitis B test. Hepatitis C test. HIV (human immunodeficiency virus) test. STI (sexually transmitted infection) testing, if you are at risk. Talk with your health care provider about your test results, treatment options, and if necessary, the need for more tests. Follow these instructions at home: Eating and drinking  Eat a healthy diet  that includes fresh fruits and vegetables, whole grains, lean protein, and low-fat dairy products. Drink enough fluid to keep your urine pale yellow. Take vitamin and mineral supplements as recommended by your health care provider. Do not drink alcohol if your health care provider tells you not to drink. If you drink alcohol: Limit how much you have to 0-2 drinks a day. Know how much alcohol is in your drink. In the [place] one  drink equals one 12 oz bottle of beer (355 mL), one 5 oz glass of wine (148 mL), or one 1 oz glass of hard liquor (44 mL). Lifestyle Brush your teeth every morning and night with fluoride toothpaste. Floss one time each day. Exercise for at least 30 minutes 5 or more days each week. Do not use any products that contain nicotine or tobacco. These products include cigarettes, chewing tobacco, and vaping devices, such as e-cigarettes. If you need help quitting, ask your health care provider. Do not use drugs. If you are sexually active, practice safe sex. Use a condom or other form of protection to prevent STIs. Find healthy ways to manage stress, such as: Meditation, yoga, or listening to music. Journaling. Talking to a trusted person. Spending time with friends and family. Minimize exposure to UV radiation to reduce your risk of skin cancer. Safety Always wear your seat belt while driving or riding in a vehicle. Do not drive: If you have been drinking alcohol. Do not ride with someone who has been drinking. If you have been using any mind-altering substances or drugs. While texting. When you are tired or distracted. Wear a helmet and other protective equipment during sports activities. If you have firearms in your house, make sure you follow all gun safety procedures. Seek help if you have been physically or sexually abused. What's next? Go to your health care provider once a year for an annual wellness visit. Ask your health care provider how often you should have your eyes and teeth checked. Stay up to date on all vaccines. This information is not intended to replace advice given to you by your health care provider. Make sure you discuss any questions you have with your health care provider. Document Revised: 06/12/2021 Document Reviewed: 06/12/2021 Elsevier Patient Education  2022 [employer].

## 2021-12-06 NOTE — Assessment & Plan Note (Signed)
Discussed lifestyle modifications.  Check labs. 

## 2021-12-09 NOTE — Progress Notes (Signed)
Please inform patient of the following:  His "bad" cholesterol is borderline but stable. Everything else is stable. Would like for him to keep working on diet and exercise and we can recheck in a year or so.  Katina Degree. Jimmey Ralph, MD 12/09/2021 12:27 PM

## 2022-09-22 ENCOUNTER — Encounter: Payer: Self-pay | Admitting: *Deleted

## 2022-12-11 ENCOUNTER — Encounter: Payer: Self-pay | Admitting: *Deleted

## 2023-05-08 ENCOUNTER — Encounter: Payer: Self-pay | Admitting: Family Medicine
